# Patient Record
Sex: Male | Born: 1985 | Race: Black or African American | Hispanic: No | Marital: Single | Smoking: Never smoker
Health system: Southern US, Community
[De-identification: ages and names within clinical notes are randomized; demographics above are authoritative.]

---

## 2012-10-28 ENCOUNTER — Ambulatory Visit (INDEPENDENT_AMBULATORY_CARE_PROVIDER_SITE_OTHER): Payer: 59 | Admitting: Emergency Medicine

## 2012-10-28 VITALS — BP 128/92 | HR 118 | Temp 99.3°F | Resp 16 | Ht 70.75 in | Wt 215.2 lb

## 2012-10-28 DIAGNOSIS — R599 Enlarged lymph nodes, unspecified: Secondary | ICD-10-CM

## 2012-10-28 DIAGNOSIS — L259 Unspecified contact dermatitis, unspecified cause: Secondary | ICD-10-CM

## 2012-10-28 DIAGNOSIS — L309 Dermatitis, unspecified: Secondary | ICD-10-CM

## 2012-10-28 DIAGNOSIS — R591 Generalized enlarged lymph nodes: Secondary | ICD-10-CM

## 2012-10-28 MED ORDER — TRIAMCINOLONE ACETONIDE 0.1 % EX CREA: TOPICAL_CREAM | Freq: Two times a day (BID) | CUTANEOUS | Status: AC

## 2012-10-28 MED ORDER — CEPHALEXIN 500 MG PO CAPS: 500.0000 mg | ORAL_CAPSULE | Freq: Four times a day (QID) | ORAL | Status: AC

## 2012-10-28 NOTE — Progress Notes (Signed)
[facility] [address], [place] [address] [phone]  Date:  10/28/2012   Name:  John Schneider   DOB:  06-24-1986   MRN:  [id number]  PCP:  No primary provider on file.    Chief Complaint: Annual Exam   History of Present Illness:  John Schneider is a 26 y.o. very pleasant male patient who presents with the following:  Noticed a lump in his neck on the left side.  Not tender.  Present for past month and has not changed.  No fever or chills, ear pain or sore throat or other lymphadenopathy  Has a raised lesion on his epigastrium that is surrounded by an itchy patch.  Has a rash on his left arm in the antecubital space that is intermittently itchy.  There is no problem list on file for this patient.   No past medical history on file.  No past surgical history on file.  History  Substance Use Topics  . Smoking status: Never Smoker   . Smokeless tobacco: Not on file  . Alcohol Use: 2.5 oz/week    5 drink(s) per week    Family History  Problem Relation Age of Onset  . Kidney disease Paternal Grandmother   . Diabetes Paternal Grandfather     No Known Allergies  Medication list has been reviewed and updated.  No current outpatient prescriptions on file prior to visit.    Review of Systems:  As per HPI, otherwise negative.    Physical Examination: Filed Vitals:   10/28/12 1136  BP: 128/92  Pulse: 118  Temp: 99.3 F (37.4 C)  Resp: 16   Filed Vitals:   10/28/12 1136  Height: 5' 10.75" (1.797 m)  Weight: 215 lb 3.2 oz (97.614 kg)   Body mass index is 30.23 kg/(m^2). Ideal Body Weight: Weight in (lb) to have BMI = 25: 177.6   GEN: WDWN, NAD, Non-toxic, A & O x 3 HEENT: Atraumatic, Normocephalic. Neck supple. No masses, solitary left posterior cervical chain lymph node.  Not fixed, mobile, tender. Ears and Nose: No external deformity. CV: RRR, No M/G/R. No JVD. No thrill. No extra heart sounds. PULM: CTA B, no wheezes,  crackles, rhonchi. No retractions. No resp. distress. No accessory muscle use. ABD: S, NT, ND, +BS. No rebound. No HSM. EXTR: No c/c/e NEURO Normal gait.  PSYCH: Normally interactive. Conversant. Not depressed or anxious appearing.  Calm demeanor.  SKIN:  eczemtous patch on left antecubital space.  Has an excoriated area on his epigastrium surrounding a raised lesion approximately one cm in diameter.  No tenderness or induration, not fluctuant  Assessment and Plan: Lymphadenopathy Keflex  Eczema TAC  Epigastric lesion Derm referral  Carmelina Dane, MD

## 2013-02-02 NOTE — Progress Notes (Signed)
Reviewed and agree.

## 2014-05-02 ENCOUNTER — Encounter (HOSPITAL_COMMUNITY): Payer: Self-pay | Admitting: Emergency Medicine

## 2014-05-02 ENCOUNTER — Emergency Department (HOSPITAL_COMMUNITY): Payer: 59

## 2014-05-02 DIAGNOSIS — IMO0002 Reserved for concepts with insufficient information to code with codable children: Secondary | ICD-10-CM | POA: Insufficient documentation

## 2014-05-02 DIAGNOSIS — Z792 Long term (current) use of antibiotics: Secondary | ICD-10-CM | POA: Insufficient documentation

## 2014-05-02 DIAGNOSIS — R05 Cough: Secondary | ICD-10-CM | POA: Insufficient documentation

## 2014-05-02 DIAGNOSIS — J3489 Other specified disorders of nose and nasal sinuses: Secondary | ICD-10-CM | POA: Insufficient documentation

## 2014-05-02 DIAGNOSIS — R059 Cough, unspecified: Secondary | ICD-10-CM | POA: Insufficient documentation

## 2014-05-02 NOTE — ED Notes (Signed)
The pt ha shad a cough and cold for 2 weeks.  Nasal congestion and chest congestion productive cough.  He feelslike he cannot get his breath. No sob now no distress

## 2014-05-03 ENCOUNTER — Emergency Department (HOSPITAL_COMMUNITY)
Admission: EM | Admit: 2014-05-03 | Discharge: 2014-05-03 | Disposition: A | Discharge status: Home / Self Care | Payer: 59 | Attending: Emergency Medicine | Admitting: Emergency Medicine

## 2014-05-03 DIAGNOSIS — R059 Cough, unspecified: Secondary | ICD-10-CM

## 2014-05-03 DIAGNOSIS — R05 Cough: Secondary | ICD-10-CM

## 2014-05-03 MED ORDER — HYDROCOD POLST-CHLORPHEN POLST 10-8 MG/5ML PO LQCR
5.0000 mL | Freq: Once | ORAL | Status: AC
  Administered 2014-05-03: 5 mL via ORAL
  Filled 2014-05-03: qty 5

## 2014-05-03 MED ORDER — HYDROCOD POLST-CHLORPHEN POLST 10-8 MG/5ML PO LQCR: 5.0000 mL | Freq: Every evening | ORAL | Status: AC | PRN

## 2014-05-03 MED ORDER — ALBUTEROL SULFATE HFA 108 (90 BASE) MCG/ACT IN AERS
1.0000 | INHALATION_SPRAY | RESPIRATORY_TRACT | Status: DC | PRN
  Administered 2014-05-03: 2 via RESPIRATORY_TRACT
  Filled 2014-05-03: qty 6.7

## 2014-05-03 MED ORDER — ALBUTEROL SULFATE HFA 108 (90 BASE) MCG/ACT IN AERS
1.0000 | INHALATION_SPRAY | Freq: Four times a day (QID) | RESPIRATORY_TRACT | Status: AC | PRN
Start: 2014-05-03 — End: ?

## 2014-05-03 NOTE — ED Provider Notes (Signed)
CSN: [id number]     Arrival date & time 05/02/14  2219 History   First MD Initiated Contact with Patient 05/03/14 0046     Chief Complaint  Patient presents with  . Cough     (Consider location/radiation/quality/duration/timing/severity/associated sxs/prior Treatment) HPI 28 year old male presents to emergency room from home with complaint of 2 weeks of cough.  Patient reports nasal congestion initially followed by chest congestion.  He has been taking Mucinex with some improvement.  He reports cough is worse at night when he lays down.  He denies any chest pain, no wheezing, no reflux symptoms.  He denies history of asthma or seasonal allergies.  No fevers or chills.  No sick contacts. History reviewed. No pertinent past medical history. History reviewed. No pertinent past surgical history. Family History  Problem Relation Age of Onset  . Kidney disease Paternal Grandmother   . Diabetes Paternal Grandfather    History  Substance Use Topics  . Smoking status: Never Smoker   . Smokeless tobacco: Not on file  . Alcohol Use: 2.5 oz/week    5 drink(s) per week    Review of Systems   See History of Present Illness; otherwise all other systems are reviewed and negative  Allergies  Review of patient's allergies indicates no known allergies.  Home Medications   Prior to Admission medications   Medication Sig Start Date End Date Taking? Authorizing Provider  albuterol (PROVENTIL HFA;VENTOLIN HFA) 108 (90 BASE) MCG/ACT inhaler Inhale 1-2 puffs into the lungs every 6 (six) hours as needed for wheezing or shortness of breath. 05/03/14   Olivia Mackielga M Tory Mckissack, MD  cephALEXin (KEFLEX) 500 MG capsule Take 1 capsule (500 mg total) by mouth 4 (four) times daily. 10/28/12   Phillips OdorJeffery Anderson, MD  chlorpheniramine-HYDROcodone (TUSSIONEX PENNKINETIC ER) 10-8 MG/5ML LQCR Take 5 mLs by mouth at bedtime as needed for cough. 05/03/14   Olivia Mackielga M Noreene Boreman, MD  triamcinolone cream (KENALOG) 0.1 % Apply topically 2 (two)  times daily. 10/28/12   Phillips OdorJeffery Anderson, MD   BP 123/79  Pulse 71  Temp(Src) 98.9 F (37.2 C) (Axillary)  Resp 18  Ht 6' (1.829 m)  Wt 230 lb (104.327 kg)  BMI 31.19 kg/m2  SpO2 100% Physical Exam  Nursing note and vitals reviewed. Constitutional: He is oriented to person, place, and time. He appears well-developed and well-nourished.  HENT:  Head: Normocephalic and atraumatic.  Right Ear: External ear normal.  Left Ear: External ear normal.  Nose: Nose normal.  Mouth/Throat: Oropharynx is clear and moist.  Eyes: Conjunctivae and EOM are normal. Pupils are equal, round, and reactive to light.  Neck: Normal range of motion. Neck supple. No JVD present. No tracheal deviation present. No thyromegaly present.  Cardiovascular: Normal rate, regular rhythm, normal heart sounds and intact distal pulses.  Exam reveals no gallop and no friction rub.   No murmur heard. Pulmonary/Chest: Effort normal and breath sounds normal. No stridor. No respiratory distress. He has no wheezes. He has no rales. He exhibits no tenderness.  No cough no wheezes  Abdominal: Soft. Bowel sounds are normal. He exhibits no distension and no mass. There is no tenderness. There is no rebound and no guarding.  Musculoskeletal: Normal range of motion. He exhibits no edema and no tenderness.  Lymphadenopathy:    He has no cervical adenopathy.  Neurological: He is alert and oriented to person, place, and time. He exhibits normal muscle tone. Coordination normal.  Skin: Skin is dry. No rash noted. No erythema. No  pallor.  Psychiatric: He has a normal mood and affect. His behavior is normal. Judgment and thought content normal.    ED Course  Procedures (including critical care time) Labs Review Labs Reviewed - No data to display  Imaging Review Dg Chest 2 View  05/02/2014   CLINICAL DATA:  Shortness of breath  EXAM: CHEST  2 VIEW  COMPARISON:  None.  FINDINGS: The heart size and mediastinal contours are within  normal limits. Both lungs are clear. The visualized skeletal structures are unremarkable.  IMPRESSION: No active cardiopulmonary disease.   Electronically Signed   By: Alcide CleverMark  Lukens M.D.   On: 05/02/2014 23:51     EKG Interpretation None      MDM   Final diagnoses:  Cough   28 year old male with 2 weeks of cough, probable bronchitis.  Will place on Tussionex for nighttime use to help with sleep, albuterol inhaler for tightness in his chest.  Patient to follow up with primary care Dr.  Cathlean SauerX-ray today does not show any signs of infection   Olivia Mackielga M Kadrian Partch, MD 05/03/14 [phone]

## 2014-05-03 NOTE — Discharge Instructions (Signed)
Cool Mist Vaporizers °Vaporizers may help relieve the symptoms of a cough and cold. They add moisture to the air, which helps mucus to become thinner and less sticky. This makes it easier to breathe and cough up secretions. Cool mist vaporizers do not cause serious burns like hot mist vaporizers ("steamers, humidifiers"). Vaporizers have not been proved to show they help with colds. You should not use a vaporizer if you are allergic to mold.  °HOME CARE INSTRUCTIONS °· Follow the package instructions for the vaporizer. °· Do not use anything other than distilled water in the vaporizer. °· Do not run the vaporizer all of the time. This can cause mold or bacteria to grow in the vaporizer. °· Clean the vaporizer after each time it is used. °· Clean and dry the vaporizer well before storing it. °· Stop using the vaporizer if worsening respiratory symptoms develop. °Document Released: 09/03/2004 Document Revised: 08/09/2013 Document Reviewed: 04/26/2013 °[employer]® Patient Information ©2014 [employer]. ° °Cough, Adult ° A cough is a reflex that helps clear your throat and airways. It can help heal the body or may be a reaction to an irritated airway. A cough may only last 2 or 3 weeks (acute) or may last more than 8 weeks (chronic).  °CAUSES °Acute cough: °· Viral or bacterial infections. °Chronic cough: °· Infections. °· Allergies. °· Asthma. °· Post-nasal drip. °· Smoking. °· Heartburn or acid reflux. °· Some medicines. °· Chronic lung problems (COPD). °· Cancer. °SYMPTOMS  °· Cough. °· Fever. °· Chest pain. °· Increased breathing rate. °· High-pitched whistling sound when breathing (wheezing). °· Colored mucus that you cough up (sputum). °TREATMENT  °· A bacterial cough may be treated with antibiotic medicine. °· A viral cough must run its course and will not respond to antibiotics. °· Your caregiver may recommend other treatments if you have a chronic cough. °HOME CARE INSTRUCTIONS  °· Only take over-the-counter or  prescription medicines for pain, discomfort, or fever as directed by your caregiver. Use cough suppressants only as directed by your caregiver. °· Use a cold steam vaporizer or humidifier in your bedroom or home to help loosen secretions. °· Sleep in a semi-upright position if your cough is worse at night. °· Rest as needed. °· Stop smoking if you smoke. °SEEK IMMEDIATE MEDICAL CARE IF:  °· You have pus in your sputum. °· Your cough starts to worsen. °· You cannot control your cough with suppressants and are losing sleep. °· You begin coughing up blood. °· You have difficulty breathing. °· You develop pain which is getting worse or is uncontrolled with medicine. °· You have a fever. °MAKE SURE YOU:  °· Understand these instructions. °· Will watch your condition. °· Will get help right away if you are not doing well or get worse. °Document Released: 06/05/2011 Document Revised: 02/29/2012 Document Reviewed: 06/05/2011 °[employer]® Patient Information ©2014 [employer]. ° °

## 2015-05-04 IMAGING — CR DG CHEST 2V
2 series · 2 of 2 positions shown · non-contrast
Comparison: None.

CLINICAL DATA: Shortness of breath

EXAM:
CHEST  2 VIEW

[w chest pa]
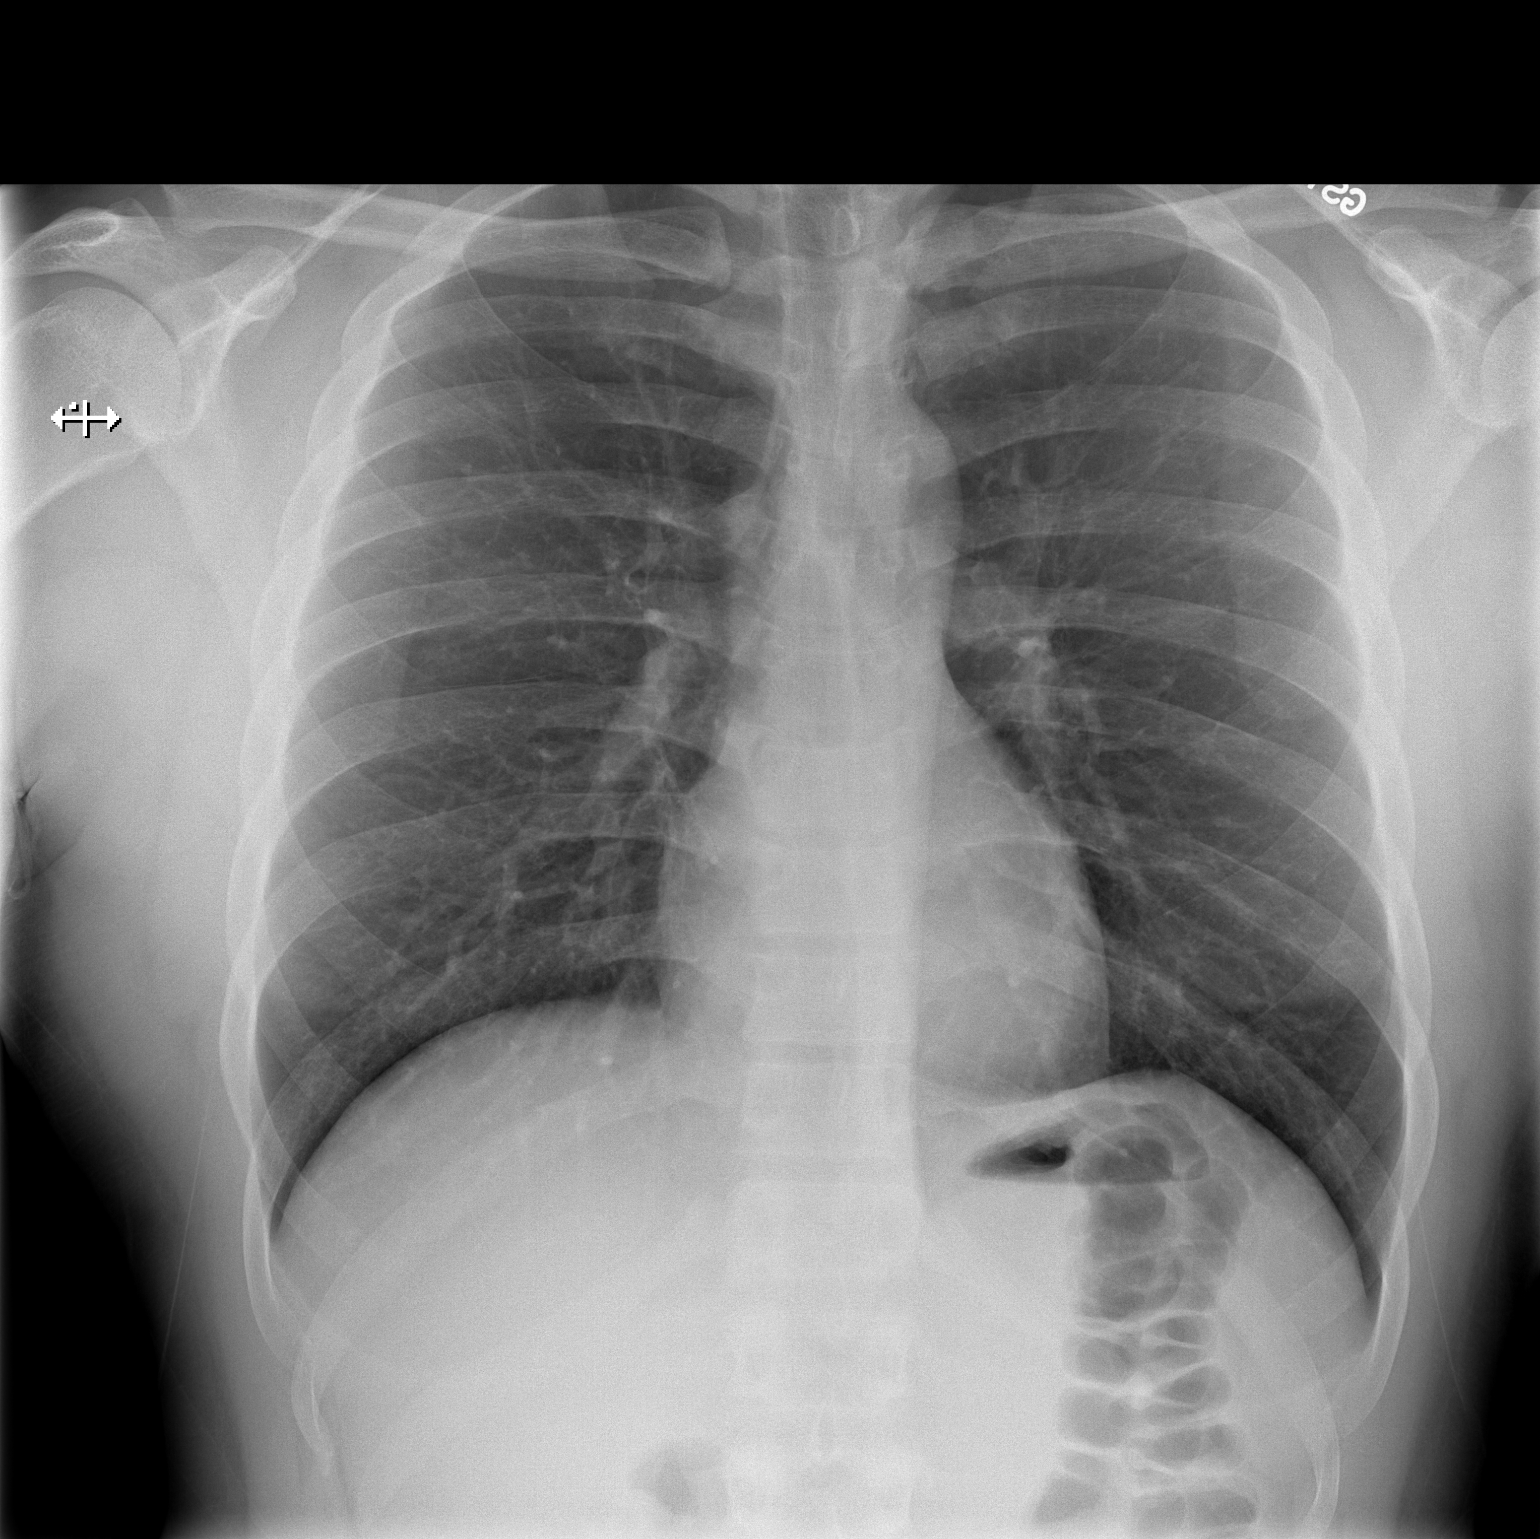

[w chest lat]
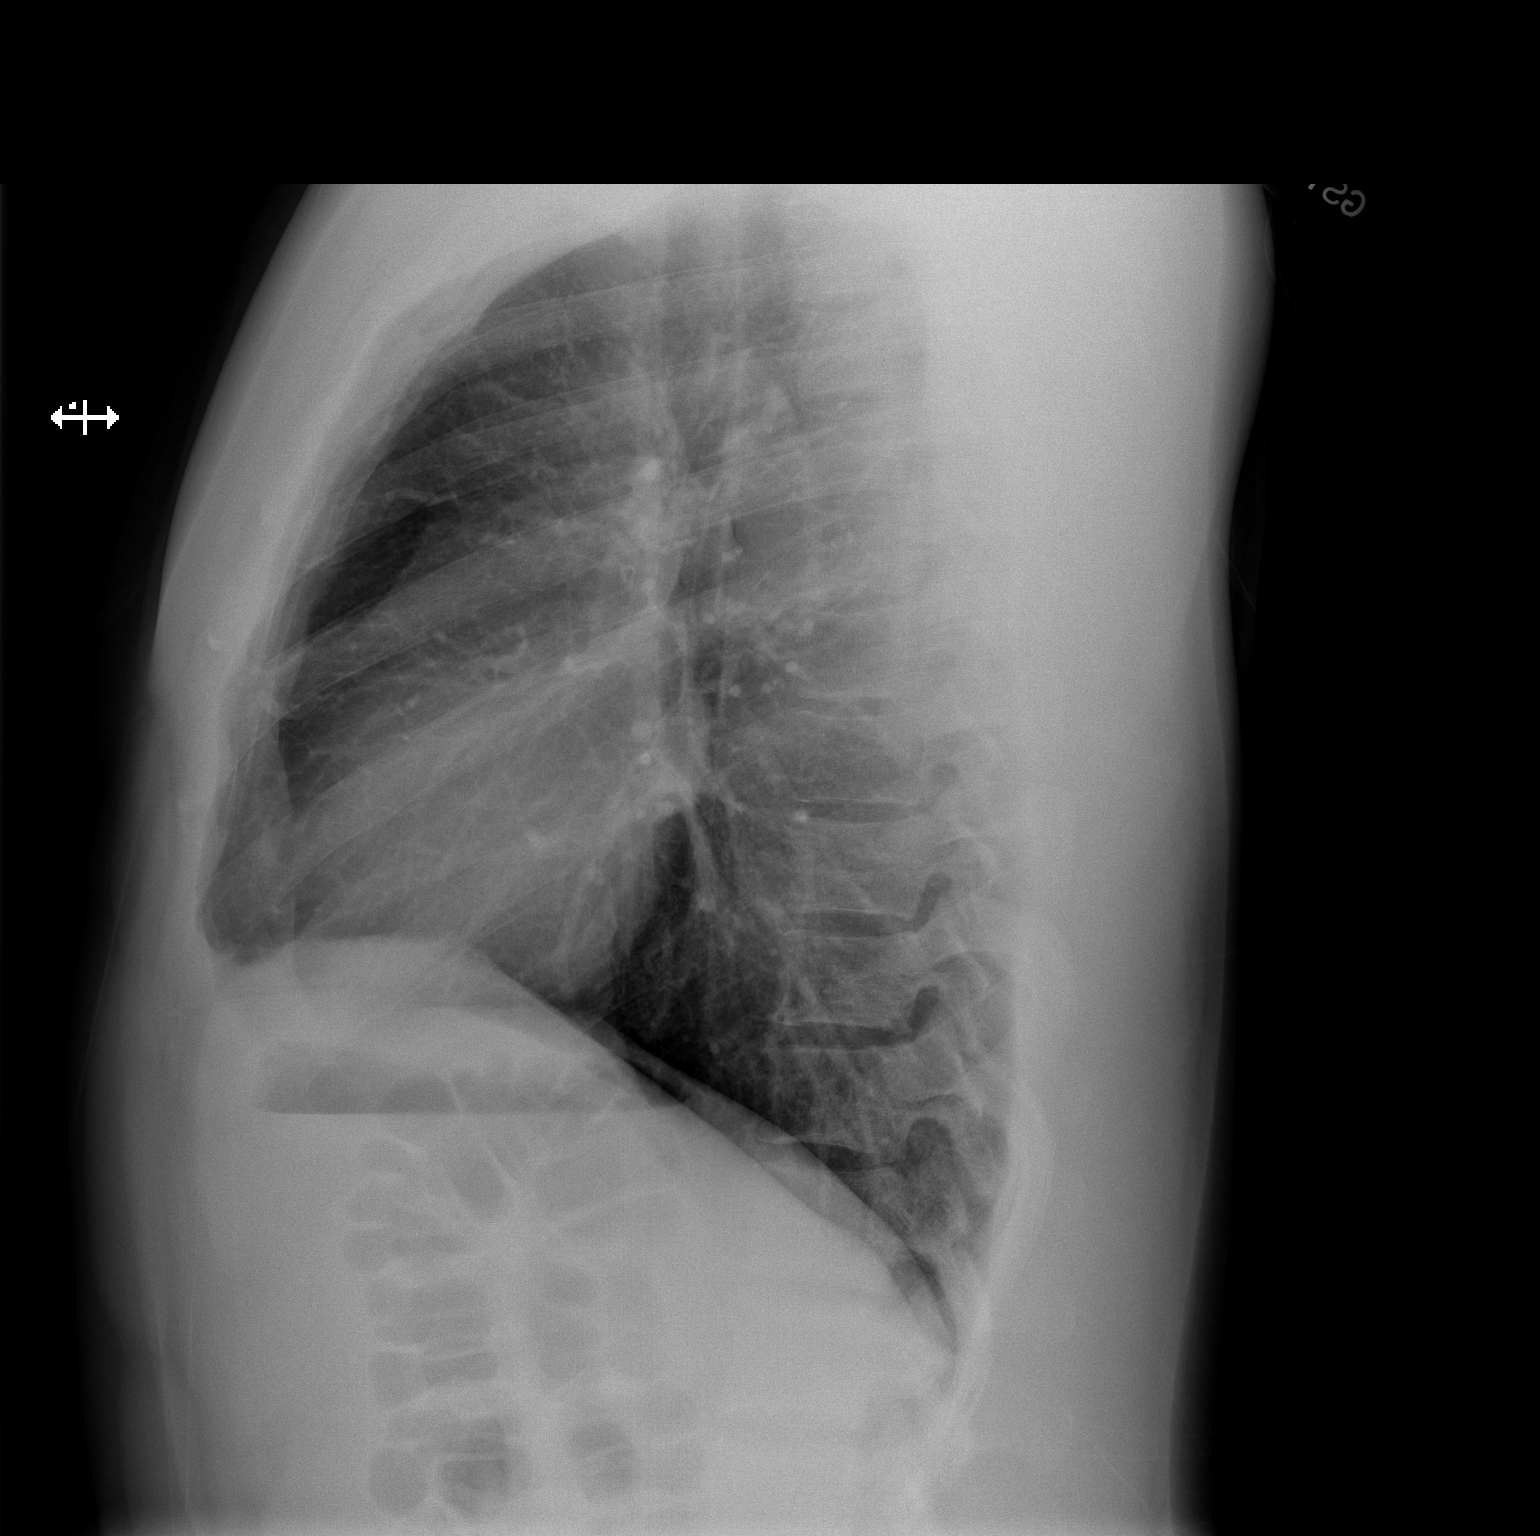

[2 of 2 positions shown; findings below may reference images not displayed]

FINDINGS: The heart size and mediastinal contours are within normal limits.
Both lungs are clear. The visualized skeletal structures are
unremarkable.
IMPRESSION: No active cardiopulmonary disease.
